# Patient Record
Sex: Female | Born: 1965 | Race: White | Hispanic: No | Marital: Married | Smoking: Former smoker
Health system: Southern US, Community
[De-identification: ages and names within clinical notes are randomized; demographics above are authoritative.]

## PROBLEM LIST (undated history)

## (undated) DIAGNOSIS — E039 Hypothyroidism, unspecified: Secondary | ICD-10-CM

## (undated) DIAGNOSIS — E785 Hyperlipidemia, unspecified: Secondary | ICD-10-CM

## (undated) DIAGNOSIS — K5790 Diverticulosis of intestine, part unspecified, without perforation or abscess without bleeding: Secondary | ICD-10-CM

## (undated) DIAGNOSIS — F410 Panic disorder [episodic paroxysmal anxiety] without agoraphobia: Secondary | ICD-10-CM

## (undated) HISTORY — PX: ABDOMINOPLASTY/PANNICULECTOMY WITH LIPOSUCTION: SHX5577

## (undated) HISTORY — DX: Hypothyroidism, unspecified: E03.9

## (undated) HISTORY — DX: Hyperlipidemia, unspecified: E78.5

## (undated) HISTORY — DX: Panic disorder (episodic paroxysmal anxiety): F41.0

---

## 2003-03-31 ENCOUNTER — Other Ambulatory Visit: Admission: RE | Admit: 2003-03-31 | Discharge: 2003-03-31 | Payer: Self-pay | Admitting: Obstetrics and Gynecology

## 2011-04-22 ENCOUNTER — Encounter: Payer: Self-pay | Admitting: Obstetrics & Gynecology

## 2011-05-07 LAB — BASIC METABOLIC PANEL
Creatinine: 0.8 mg/dL (ref 0.5–1.1)
Glucose: 99 mg/dL
POTASSIUM: 3.9 mmol/L (ref 3.4–5.3)

## 2011-05-07 LAB — LIPID PANEL
Cholesterol: 233 mg/dL — AB (ref 0–200)
HDL: 54 mg/dL (ref 35–70)
LDL Cholesterol: 144 mg/dL
Triglycerides: 177 mg/dL — AB (ref 40–160)

## 2011-05-07 LAB — CBC AND DIFFERENTIAL
HEMOGLOBIN: 14.5 g/dL (ref 12.0–16.0)
Platelets: 248 10*3/uL (ref 150–399)
WBC: 6.1 10*3/mL

## 2011-05-07 LAB — HEPATIC FUNCTION PANEL
ALT: 23 U/L (ref 7–35)
AST: 23 U/L (ref 13–35)
Alkaline Phosphatase: 48 U/L (ref 25–125)

## 2011-05-07 LAB — CMP14+LP+1AC+CBC/D/PLT+TSH: Calcium: 10.1 mg/dL

## 2012-05-16 ENCOUNTER — Emergency Department (INDEPENDENT_AMBULATORY_CARE_PROVIDER_SITE_OTHER)
Admission: EM | Admit: 2012-05-16 | Discharge: 2012-05-16 | Disposition: A | Discharge status: Home / Self Care | Payer: BC Managed Care – PPO | Source: Home / Self Care

## 2012-05-16 ENCOUNTER — Encounter: Payer: Self-pay | Admitting: *Deleted

## 2012-05-16 DIAGNOSIS — K5732 Diverticulitis of large intestine without perforation or abscess without bleeding: Secondary | ICD-10-CM

## 2012-05-16 HISTORY — DX: Diverticulosis of intestine, part unspecified, without perforation or abscess without bleeding: K57.90

## 2012-05-16 MED ORDER — SULFAMETHOXAZOLE-TRIMETHOPRIM 800-160 MG PO TABS: 1.0000 | ORAL_TABLET | Freq: Two times a day (BID) | ORAL | Status: DC

## 2012-05-16 MED ORDER — METRONIDAZOLE 500 MG PO TABS: 500.0000 mg | ORAL_TABLET | Freq: Four times a day (QID) | ORAL | Status: DC

## 2012-05-16 MED ORDER — CIPROFLOXACIN HCL 750 MG PO TABS: 750.0000 mg | ORAL_TABLET | Freq: Two times a day (BID) | ORAL | Status: DC

## 2012-05-16 NOTE — ED Provider Notes (Signed)
History     CSN: [id number]  Arrival date & time 05/16/12  1133   None     Chief Complaint  Patient presents with  . Diarrhea  . Nausea    (Consider location/radiation/quality/duration/timing/severity/associated sxs/prior treatment) HPI  This patient complains of nausea and diarrhea with blood in her stool for the last day or 2.  She does have a history of diverticulosis as diagnosed on a recent colonoscopy.  She also has a family history of diverticulosis and diverticulitis.  She has tried some over-the-counter ibuprofen and Tylenol which does help with the fevers.  The symptoms started out with a fever with no other symptoms and then progressed to the diarrhea with some stomach cramps and blood.  No recent travel or possible food poisoning.  Past Medical History  Diagnosis Date  . Diverticulosis     Past Surgical History  Procedure Laterality Date  . Cesarean section    . Abdominoplasty/panniculectomy with liposuction      Family History  Problem Relation Age of Onset  . Ulcerative colitis Mother   . Diverticulitis Mother   . Cancer Father   . Diverticulitis Father     History  Substance Use Topics  . Smoking status: Former Games developer  . Smokeless tobacco: Not on file  . Alcohol Use: Yes    OB History   Grav Para Term Preterm Abortions TAB SAB Ect Mult Living                  Review of Systems  All other systems reviewed and are negative.    Allergies  Penicillins  Home Medications   Current Outpatient Rx  Name  Route  Sig  Dispense  Refill  . ciprofloxacin (CIPRO) 750 MG tablet   Oral   Take 1 tablet (750 mg total) by mouth 2 (two) times daily.   18 tablet   0   . metroNIDAZOLE (FLAGYL) 500 MG tablet   Oral   Take 1 tablet (500 mg total) by mouth 4 (four) times daily.   36 tablet   0   . sulfamethoxazole-trimethoprim (SEPTRA DS) 800-160 MG per tablet   Oral   Take 1 tablet by mouth 2 (two) times daily.   18 tablet   0     BP 115/82   Pulse 90  Temp(Src) 97.4 F (36.3 C) (Oral)  Resp 16  Ht 5' 3.75" (1.619 m)  Wt 152 lb 8 oz (69.174 kg)  BMI 26.39 kg/m2  SpO2 98%  Physical Exam  Nursing note and vitals reviewed. Constitutional: She is oriented to person, place, and time. She appears well-developed and well-nourished.  HENT:  Head: Normocephalic and atraumatic.  Eyes: No scleral icterus.  Neck: Neck supple.  Cardiovascular: Normal rate, regular rhythm and normal heart sounds.   Pulmonary/Chest: Effort normal and breath sounds normal. No respiratory distress. She has no decreased breath sounds. She has no wheezes. She has no rhonchi.  Abdominal: Soft. Normal appearance. There is no tenderness. There is no rigidity, no rebound, no guarding, no CVA tenderness, no tenderness at McBurney's point and negative Murphy's sign.  Neurological: She is alert and oriented to person, place, and time.  Skin: Skin is warm and dry.  Psychiatric: She has a normal mood and affect. Her speech is normal.    ED Course  Procedures (including critical care time)  Labs Reviewed - No data to display No results found.   1. Diverticulitis of colon without hemorrhage  MDM   With her clinical picture and a history of diverticulosis, I believe that she does have diverticulitis.  Or at least have to assume until proven otherwise.  I was going to treat her with Cipro and Flagyl, but she states that she gets nauseous with Cipro and change that to Bactrim DS.  The treatment will be for 9 days and a letter no that she needs to be doing a bland diet if not a liquid diet for the next few days.  She also needs to inform her gastroenterologist her PCP about this.  I educated her on it and she thinks, now looking back, that she's had multiple of these episodes in the last few years.  I do not see any evidence of sepsis or severe disease needing to go to the hospital.  However if she is getting worse, then a CAT scan may be appropriate at that  time.  We're going to call the patient back in about 2 days and check on her.  Marlaine Hind, MD 05/16/12 [phone]

## 2012-05-16 NOTE — ED Notes (Signed)
Pt c/o nausea and diarrhea with blood in bowel; states she has a hx of diverticulosis. Has tried OTC Ibuprofen and Tylenol.

## 2012-05-18 ENCOUNTER — Telehealth: Payer: Self-pay | Admitting: *Deleted

## 2012-09-14 LAB — HEMOGLOBIN A1C: HEMOGLOBIN A1C: 5.4 % (ref 4.0–6.0)

## 2013-01-13 LAB — HEMOGLOBIN A1C: HEMOGLOBIN A1C: 5.5 % (ref 4.0–6.0)

## 2013-04-28 LAB — HEMOGLOBIN A1C: Hgb A1c MFr Bld: 5.2 % (ref 4.0–6.0)

## 2013-08-04 LAB — HEMOGLOBIN A1C: HEMOGLOBIN A1C: 5.4 % (ref 4.0–6.0)

## 2013-08-08 LAB — HM MAMMOGRAPHY: HM MAMMO: ABNORMAL

## 2013-08-11 ENCOUNTER — Other Ambulatory Visit: Payer: Self-pay | Admitting: Obstetrics and Gynecology

## 2013-08-11 DIAGNOSIS — Z139 Encounter for screening, unspecified: Secondary | ICD-10-CM

## 2013-08-18 ENCOUNTER — Encounter: Payer: Self-pay | Admitting: Sports Medicine

## 2013-08-18 ENCOUNTER — Ambulatory Visit (INDEPENDENT_AMBULATORY_CARE_PROVIDER_SITE_OTHER): Payer: BC Managed Care – PPO | Admitting: Sports Medicine

## 2013-08-18 ENCOUNTER — Ambulatory Visit (INDEPENDENT_AMBULATORY_CARE_PROVIDER_SITE_OTHER): Payer: BC Managed Care – PPO

## 2013-08-18 VITALS — BP 135/86 | HR 71 | Ht 64.0 in | Wt 160.0 lb

## 2013-08-18 DIAGNOSIS — M25561 Pain in right knee: Secondary | ICD-10-CM | POA: Insufficient documentation

## 2013-08-18 DIAGNOSIS — M25569 Pain in unspecified knee: Secondary | ICD-10-CM

## 2013-08-18 DIAGNOSIS — M23302 Other meniscus derangements, unspecified lateral meniscus, unspecified knee: Secondary | ICD-10-CM

## 2013-08-18 MED ORDER — MELOXICAM 15 MG PO TABS: ORAL_TABLET | ORAL | Status: DC

## 2013-08-18 NOTE — Assessment & Plan Note (Addendum)
After a twisting and slipping injury while playing tennis, I do have suspicion for injury to the anterior cruciate ligament, I was unable to get a good examination due to guarding. X-rays, Mobic, MRI. Return to go over MRI results. Continue knee sleeve

## 2013-08-18 NOTE — Progress Notes (Signed)
   Subjective:    I'm seeing this patient as a consultation for:  Dr. Angelena SoleWeston Saunders  CC: Right knee pain  HPI: This is a pleasant 48 year old female, for the past several weeks she's had pain which he localizes along the medial and lateral joint line, moderate, persistent. While playing tennis she recalls taking a misstep and feeling of popping or grinding. She did not have any immediate swelling and was able to continue playing. This happened a second time and she decided to come in to be seen. Currently she has no pain.  Past medical history, Surgical history, Family history not pertinant except as noted below, Social history, Allergies, and medications have been entered into the medical record, reviewed, and no changes needed.   Review of Systems: No headache, visual changes, nausea, vomiting, diarrhea, constipation, dizziness, abdominal pain, skin rash, fevers, chills, night sweats, weight loss, swollen lymph nodes, body aches, joint swelling, muscle aches, chest pain, shortness of breath, mood changes, visual or auditory hallucinations.   Objective:   General: Well Developed, well nourished, and in no acute distress.  Neuro/Psych: Alert and oriented x3, extra-ocular muscles intact, able to move all 4 extremities, sensation grossly intact. Skin: Warm and dry, no rashes noted.  Respiratory: Not using accessory muscles, speaking in full sentences, trachea midline.  Cardiovascular: Pulses palpable, no extremity edema. Abdomen: Does not appear distended. Right Knee: Normal to inspection with no erythema or effusion or obvious bony abnormalities. Palpation normal with no warmth, joint line tenderness, patellar tenderness, or condyle tenderness. ROM full in flexion and extension and lower leg rotation. Ligaments with solid consistent endpoints including ACL, PCL, LCL, MCL. I was unable to give a very solid ACL exam due to her guarding. Negative Mcmurray's, Apley's, and Thessalonian  tests. Non painful patellar compression. Patellar glide without crepitus. Patellar and quadriceps tendons unremarkable. Hamstring and quadriceps strength is normal.   X-rays show moderate medial compartment osteoarthritis.  Impression and Recommendations:   This case required medical decision making of moderate complexity.

## 2013-08-22 ENCOUNTER — Telehealth: Payer: Self-pay | Admitting: *Deleted

## 2013-08-22 NOTE — Telephone Encounter (Signed)
MRI of knee precert [id number] valid for 30 days as per [facility] [employer]. Corliss SkainsJamie Painter, CMA

## 2013-08-30 ENCOUNTER — Ambulatory Visit (INDEPENDENT_AMBULATORY_CARE_PROVIDER_SITE_OTHER): Payer: BC Managed Care – PPO

## 2013-08-30 DIAGNOSIS — Z1231 Encounter for screening mammogram for malignant neoplasm of breast: Secondary | ICD-10-CM

## 2013-08-30 DIAGNOSIS — Z139 Encounter for screening, unspecified: Secondary | ICD-10-CM

## 2013-08-31 ENCOUNTER — Other Ambulatory Visit: Payer: BC Managed Care – PPO

## 2013-09-14 ENCOUNTER — Other Ambulatory Visit: Payer: BC Managed Care – PPO

## 2013-10-26 ENCOUNTER — Emergency Department (HOSPITAL_BASED_OUTPATIENT_CLINIC_OR_DEPARTMENT_OTHER)
Admission: EM | Admit: 2013-10-26 | Discharge: 2013-10-27 | Disposition: A | Discharge status: Home / Self Care | Payer: BC Managed Care – PPO | Attending: Emergency Medicine | Admitting: Emergency Medicine

## 2013-10-26 ENCOUNTER — Encounter (HOSPITAL_BASED_OUTPATIENT_CLINIC_OR_DEPARTMENT_OTHER): Payer: Self-pay | Admitting: Emergency Medicine

## 2013-10-26 DIAGNOSIS — Z791 Long term (current) use of non-steroidal anti-inflammatories (NSAID): Secondary | ICD-10-CM | POA: Insufficient documentation

## 2013-10-26 DIAGNOSIS — K5732 Diverticulitis of large intestine without perforation or abscess without bleeding: Secondary | ICD-10-CM | POA: Insufficient documentation

## 2013-10-26 DIAGNOSIS — R109 Unspecified abdominal pain: Secondary | ICD-10-CM | POA: Diagnosis present

## 2013-10-26 DIAGNOSIS — Z3202 Encounter for pregnancy test, result negative: Secondary | ICD-10-CM | POA: Insufficient documentation

## 2013-10-26 DIAGNOSIS — Z880 Allergy status to penicillin: Secondary | ICD-10-CM | POA: Diagnosis not present

## 2013-10-26 DIAGNOSIS — Z87891 Personal history of nicotine dependence: Secondary | ICD-10-CM | POA: Insufficient documentation

## 2013-10-26 DIAGNOSIS — Z9889 Other specified postprocedural states: Secondary | ICD-10-CM | POA: Diagnosis not present

## 2013-10-26 LAB — PREGNANCY, URINE: Preg Test, Ur: NEGATIVE

## 2013-10-26 LAB — CBC WITH DIFFERENTIAL/PLATELET
BASOS ABS: 0 10*3/uL (ref 0.0–0.1)
BASOS PCT: 0 % (ref 0–1)
EOS PCT: 1 % (ref 0–5)
Eosinophils Absolute: 0.1 10*3/uL (ref 0.0–0.7)
HCT: 43 % (ref 36.0–46.0)
Hemoglobin: 14.4 g/dL (ref 12.0–15.0)
LYMPHS PCT: 13 % (ref 12–46)
Lymphs Abs: 1.8 10*3/uL (ref 0.7–4.0)
MCH: 30.6 pg (ref 26.0–34.0)
MCHC: 33.5 g/dL (ref 30.0–36.0)
MCV: 91.3 fL (ref 78.0–100.0)
Monocytes Absolute: 1.3 10*3/uL — ABNORMAL HIGH (ref 0.1–1.0)
Monocytes Relative: 10 % (ref 3–12)
Neutro Abs: 10 10*3/uL — ABNORMAL HIGH (ref 1.7–7.7)
Neutrophils Relative %: 76 % (ref 43–77)
PLATELETS: 200 10*3/uL (ref 150–400)
RBC: 4.71 MIL/uL (ref 3.87–5.11)
RDW: 12.3 % (ref 11.5–15.5)
WBC: 13.2 10*3/uL — AB (ref 4.0–10.5)

## 2013-10-26 LAB — URINALYSIS, ROUTINE W REFLEX MICROSCOPIC
BILIRUBIN URINE: NEGATIVE
Glucose, UA: NEGATIVE mg/dL
HGB URINE DIPSTICK: NEGATIVE
Ketones, ur: NEGATIVE mg/dL
Leukocytes, UA: NEGATIVE
Nitrite: NEGATIVE
PROTEIN: NEGATIVE mg/dL
Specific Gravity, Urine: 1.016 (ref 1.005–1.030)
UROBILINOGEN UA: 0.2 mg/dL (ref 0.0–1.0)
pH: 6.5 (ref 5.0–8.0)

## 2013-10-26 LAB — COMPREHENSIVE METABOLIC PANEL
ALT: 20 U/L (ref 0–35)
ANION GAP: 16 — AB (ref 5–15)
AST: 23 U/L (ref 0–37)
Albumin: 4.1 g/dL (ref 3.5–5.2)
Alkaline Phosphatase: 62 U/L (ref 39–117)
BILIRUBIN TOTAL: 0.7 mg/dL (ref 0.3–1.2)
BUN: 12 mg/dL (ref 6–23)
CHLORIDE: 98 meq/L (ref 96–112)
CO2: 28 meq/L (ref 19–32)
CREATININE: 1 mg/dL (ref 0.50–1.10)
Calcium: 10.5 mg/dL (ref 8.4–10.5)
GFR calc Af Amer: 75 mL/min — ABNORMAL LOW (ref >90)
GFR, EST NON AFRICAN AMERICAN: 65 mL/min — AB (ref >90)
Glucose, Bld: 117 mg/dL — ABNORMAL HIGH (ref 70–99)
Potassium: 4.6 mEq/L (ref 3.7–5.3)
Sodium: 142 mEq/L (ref 137–147)
Total Protein: 8.2 g/dL (ref 6.0–8.3)

## 2013-10-26 LAB — LIPASE, BLOOD: Lipase: 30 U/L (ref 11–59)

## 2013-10-26 NOTE — ED Notes (Signed)
C/o left side abd pain stared last night-fever started today-denies n/v/d

## 2013-10-27 MED ORDER — HYDROCODONE-ACETAMINOPHEN 5-325 MG PO TABS: 2.0000 | ORAL_TABLET | ORAL | Status: DC | PRN

## 2013-10-27 MED ORDER — METRONIDAZOLE 500 MG PO TABS: 500.0000 mg | ORAL_TABLET | Freq: Three times a day (TID) | ORAL | Status: DC

## 2013-10-27 MED ORDER — CIPROFLOXACIN HCL 500 MG PO TABS: 500.0000 mg | ORAL_TABLET | Freq: Two times a day (BID) | ORAL | Status: DC

## 2013-10-27 MED ORDER — METRONIDAZOLE 500 MG PO TABS
500.0000 mg | ORAL_TABLET | Freq: Once | ORAL | Status: AC
  Administered 2013-10-27: 500 mg via ORAL
  Filled 2013-10-27: qty 1

## 2013-10-27 MED ORDER — CIPROFLOXACIN IN D5W 400 MG/200ML IV SOLN
400.0000 mg | Freq: Once | INTRAVENOUS | Status: AC
  Administered 2013-10-27: 400 mg via INTRAVENOUS
  Filled 2013-10-27: qty 200

## 2013-10-27 NOTE — Discharge Instructions (Signed)

## 2013-10-27 NOTE — ED Provider Notes (Signed)
CSN: [id number]     Arrival date & time 10/26/13  2023 History   First MD Initiated Contact with Patient 10/27/13 0002     Chief Complaint  Patient presents with  . Abdominal Pain     (Consider location/radiation/quality/duration/timing/severity/associated sxs/prior Treatment) HPI Comments: Patient presents to the ER for evaluation of left-sided abdominal pain with a low-grade fever. Symptoms began earlier today. She has not had a nausea, vomiting or diarrhea. Patient reports that she has a history of diverticulosis diagnosed by colonoscopy. She had colonoscopy because of irritable bowel syndrome and rectal bleeding many years ago. She was expecting similar symptoms approximately a year ago was treated empirically for diverticulitis.   Patient is a 48 y.o. female presenting with abdominal pain.  Abdominal Pain Associated symptoms: fever     Past Medical History  Diagnosis Date  . Diverticulosis    Past Surgical History  Procedure Laterality Date  . Cesarean section    . Abdominoplasty/panniculectomy with liposuction     Family History  Problem Relation Age of Onset  . Ulcerative colitis Mother   . Diverticulitis Mother   . Cancer Father   . Diverticulitis Father    History  Substance Use Topics  . Smoking status: Former Games developer  . Smokeless tobacco: Not on file  . Alcohol Use: Yes   OB History   Grav Para Term Preterm Abortions TAB SAB Ect Mult Living                 Review of Systems  Constitutional: Positive for fever.  Gastrointestinal: Positive for abdominal pain.  All other systems reviewed and are negative.     Allergies  Penicillins  Home Medications   Prior to Admission medications   Medication Sig Start Date End Date Taking? Authorizing Provider  meloxicam (MOBIC) 15 MG tablet One tab PO qAM with breakfast for 2 weeks, then daily prn pain. 08/18/13   Monica Becton, MD   BP 137/86  Pulse 84  Temp(Src) 99.8 F (37.7 C) (Oral)  Resp 16  Ht   (1.6 m)  Wt 158 lb (71.668 kg)  BMI 28.00 kg/m2  SpO2 99% Physical Exam  Constitutional: She is oriented to person, place, and time. She appears well-developed and well-nourished. No distress.  HENT:  Head: Normocephalic and atraumatic.  Right Ear: Hearing normal.  Left Ear: Hearing normal.  Nose: Nose normal.  Mouth/Throat: Oropharynx is clear and moist and mucous membranes are normal.  Eyes: Conjunctivae and EOM are normal. Pupils are equal, round, and reactive to light.  Neck: Normal range of motion. Neck supple.  Cardiovascular: Regular rhythm, S1 normal and S2 normal.  Exam reveals no gallop and no friction rub.   No murmur heard. Pulmonary/Chest: Effort normal and breath sounds normal. No respiratory distress. She exhibits no tenderness.  Abdominal: Soft. Normal appearance and bowel sounds are normal. There is no hepatosplenomegaly. There is tenderness in the left lower quadrant. There is no rebound, no guarding, no tenderness at McBurney's point and negative Murphy's sign. No hernia.  Musculoskeletal: Normal range of motion.  Neurological: She is alert and oriented to person, place, and time. She has normal strength. No cranial nerve deficit or sensory deficit. Coordination normal. GCS eye subscore is 4. GCS verbal subscore is 5. GCS motor subscore is 6.  Skin: Skin is warm, dry and intact. No rash noted. No cyanosis.  Psychiatric: She has a normal mood and affect. Her speech is normal and behavior is normal. Thought content normal.  ED Course  Procedures (including critical care time) Labs Review Labs Reviewed  COMPREHENSIVE METABOLIC PANEL - Abnormal; Notable for the following:    Glucose, Bld 117 (*)    GFR calc non Af Amer 65 (*)    GFR calc Af Amer 75 (*)    Anion gap 16 (*)    All other components within normal limits  CBC WITH DIFFERENTIAL - Abnormal; Notable for the following:    WBC 13.2 (*)    Neutro Abs 10.0 (*)    Monocytes Absolute 1.3 (*)    All  other components within normal limits  URINALYSIS, ROUTINE W REFLEX MICROSCOPIC  PREGNANCY, URINE  LIPASE, BLOOD    Imaging Review No results found.   EKG Interpretation None      MDM   Final diagnoses:  None   abdominal pain  Diverticulitis  Patient presents to the ER for evaluation of low-grade fever and left lower quadrant abdominal pain. She does not have fever arrival to the ER. The patient reports a history of diverticulosis seen on colonoscopy. She was treated once in the past empirically for diverticulitis with resolution of the symptoms. Examination today does reveal tenderness over quadrant without any peritoneal signs. I suspect that this is diverticulitis. I did have a lengthy conversation with the patient about diagnostics including CT scan. She does not wish to have the CT scan performed at this time. I do not suspect any carpeting factors such as bowel perforation or abscess based on her examination presentation. The patient was therefore initiated on empiric treatment, told to come back to the ER immediately if she is running high fevers, increasing pain.    Gilda Crease, MD 10/27/13 [phone]

## 2014-02-06 ENCOUNTER — Encounter: Payer: Self-pay | Admitting: Family Medicine

## 2014-02-06 ENCOUNTER — Ambulatory Visit (INDEPENDENT_AMBULATORY_CARE_PROVIDER_SITE_OTHER): Payer: BC Managed Care – PPO | Admitting: Family Medicine

## 2014-02-06 VITALS — BP 149/91 | HR 71 | Ht 63.5 in | Wt 163.0 lb

## 2014-02-06 DIAGNOSIS — K589 Irritable bowel syndrome without diarrhea: Secondary | ICD-10-CM | POA: Diagnosis not present

## 2014-02-06 DIAGNOSIS — M79641 Pain in right hand: Secondary | ICD-10-CM | POA: Diagnosis not present

## 2014-02-06 DIAGNOSIS — Z23 Encounter for immunization: Secondary | ICD-10-CM | POA: Diagnosis not present

## 2014-02-06 DIAGNOSIS — M79672 Pain in left foot: Secondary | ICD-10-CM

## 2014-02-06 DIAGNOSIS — R030 Elevated blood-pressure reading, without diagnosis of hypertension: Secondary | ICD-10-CM | POA: Diagnosis not present

## 2014-02-06 DIAGNOSIS — R0602 Shortness of breath: Secondary | ICD-10-CM

## 2014-02-06 DIAGNOSIS — Z1159 Encounter for screening for other viral diseases: Secondary | ICD-10-CM

## 2014-02-06 DIAGNOSIS — Z2839 Other underimmunization status: Secondary | ICD-10-CM

## 2014-02-06 DIAGNOSIS — IMO0001 Reserved for inherently not codable concepts without codable children: Secondary | ICD-10-CM

## 2014-02-06 DIAGNOSIS — K5730 Diverticulosis of large intestine without perforation or abscess without bleeding: Secondary | ICD-10-CM | POA: Insufficient documentation

## 2014-02-06 DIAGNOSIS — Z283 Underimmunization status: Secondary | ICD-10-CM

## 2014-02-06 MED ORDER — ALBUTEROL SULFATE HFA 108 (90 BASE) MCG/ACT IN AERS: INHALATION_SPRAY | RESPIRATORY_TRACT | Status: DC

## 2014-02-06 NOTE — Addendum Note (Signed)
Addended by: Avon GullyMCCRIMMON, Della Scrivener C on: 02/06/2014 11:30 AM   Modules accepted: Medications

## 2014-02-06 NOTE — Progress Notes (Signed)
CC: Patricia Farmer is a 48 y.o. female is here for Establish Care and Tdap and Hep B titers   Subjective: HPI:  Pleasant 48 year old here to establish care  Complains of right hand pain that is localized at the base of the thumb that has been present for at least a month now. Presently daily basis. Worse if she sleeps with her body or head on that hand. Described as a soreness. Interventions have included massage therapy yesterday with no benefit. No other interventions as of yet. She denies swelling redness or any overlying skin changes at the site of the pain. It radiates up the lateral forearm. Other than above nothing particularly makes it better or worse. She denies any recent or remote injury to this hand.  She's been offered a hepatitis B vaccination at work and she wants to know whether or not she should get this. She doesn't want to get it if she doesn't absolutely need to have it but she does want to ensure that she is immune to hepatitis B virus. She believes she may have gotten this vaccine as a child but never had a booster in adulthood. She also wants to know if she should have a Tdap vaccination. It's been well over 10 years since she had a tetanus booster.  Elevated blood pressure: She carries no formal diagnosis of hypertension. She is currently not an any blood pressure lowering medications but she does work out regularly. On chart review the majority of blood pressures over the past 1 or 2 years have always been in the normotensive or prehypertensive range.  She complains of a history of irritable bowel syndrome. She had a colonoscopy within the last 5 years and was found to have a premalignant polyp per her report. She wants to know she should get a colonoscopy at age 54 even though it's not due yet. She currently denies any constipation diarrhea or abdominal pain  Complains of left foot pain that has been present for matter of at least one year. It's present on a daily basis for  mild to moderate in severity. It's worse when plantar flexing the foot and localized near the ball of the foot deep within the foot and nonradiating. Pain came on suddenly during a tennis match about a year ago. Symptoms are slightly improved with doing home physical therapy. No other interventions.  Complains of shortness of breath that has been present for an unknown amount of time and only occurs when she is exercising. It does not seem to be getting better or worse since its onset months ago. Her mother had a history of asthma however the patient denies any known lung disease other than a smoking history in the remote past. There is been no chest pain with exertion   Review of Systems - General ROS: negative for - chills, fever, night sweats, weight gain or weight loss Ophthalmic ROS: negative for - decreased vision Psychological ROS: negative for - anxiety or depression ENT ROS: negative for - hearing change, nasal congestion, tinnitus or allergies Hematological and Lymphatic ROS: negative for - bleeding problems, bruising or swollen lymph nodes Breast ROS: negative Respiratory ROS: no cough,  or wheezing Cardiovascular ROS: no chest pain or dyspnea on exertion Gastrointestinal ROS: no abdominal pain, change in bowel habits, or black or bloody stools Genito-Urinary ROS: negative for - genital discharge, genital ulcers, incontinence or abnormal bleeding from genitals Musculoskeletal ROS: negative for - joint pain or muscle pain Neurological ROS: negative for - headaches  or memory loss Dermatological ROS: negative for lumps, mole changes, rash and skin lesion changes  Past Medical History  Diagnosis Date  . Diverticulosis     Past Surgical History  Procedure Laterality Date  . Cesarean section    . Abdominoplasty/panniculectomy with liposuction     Family History  Problem Relation Age of Onset  . Ulcerative colitis Mother   . Diverticulitis Mother   . Cancer Father   .  Diverticulitis Father     History   Social History  . Marital Status: Married    Spouse Name: N/A    Number of Children: N/A  . Years of Education: N/A   Occupational History  . Not on file.   Social History Main Topics  . Smoking status: Former Games developermoker  . Smokeless tobacco: Not on file  . Alcohol Use: Yes  . Drug Use: No  . Sexual Activity: Not on file   Other Topics Concern  . Not on file   Social History Narrative     Objective: BP 149/91 mmHg  Pulse 71  Ht 5' 3.5" (1.613 m)  Wt 163 lb (73.936 kg)  BMI 28.42 kg/m2  General: Alert and Oriented, No Acute Distress HEENT: Pupils equal, round, reactive to light. Conjunctivae clear.  External ears unremarkable, canals clear with intact TMs with appropriate landmarks.  Middle ear appears open without effusion. Pink inferior turbinates.  Moist mucous membranes, pharynx without inflammation nor lesions.  Neck supple without palpable lymphadenopathy nor abnormal masses. Lungs: Clear to auscultation bilaterally, no wheezing/ronchi/rales.  Comfortable work of breathing. Good air movement. Cardiac: Regular rate and rhythm. Normal S1/S2.  No murmurs, rubs, nor gallops.   Extremities: No peripheral edema.  Strong peripheral pulses.  Right Wrist Exam: Full ROM and strength in the wrist without any swelling/redness/warmth at the site of her discomfort.  No pain in the anatomical snuff box.  Finklestein's positive.   Left Foot Exam: Ful ROM and strength. No pain on the inferior aspect of the calcaneus with palpation. No pain at the medial or lateral malleoli. No pain at the base of the fifth metatarsal. No pain with palpation of the plantar surface of the metatarsal heads nor is there pain with plantar dorsal manipulation of any of the metatarsals. Mental Status: No depression, anxiety, nor agitation. Skin: Warm and dry.  Assessment & Plan: Patricia Farmer was seen today for establish care and tdap and hep b titers.  Diagnoses and associated  orders for this visit:  Immunization deficiency - Tdap vaccine greater than or equal to 7yo IM  Need for hepatitis B screening test - Hepatitis B Surface AntiBODY  Right hand pain  Elevated blood pressure - Lipid panel - BASIC METABOLIC PANEL WITH GFR  IBS (irritable bowel syndrome)  Left foot pain  Shortness of breath on exertion - albuterol (PROVENTIL HFA;VENTOLIN HFA) 108 (90 BASE) MCG/ACT inhaler; Inhale two puffs every 4-6 hours only as needed for shortness of breath or wheezing.    Immunization deficiency: Received Tdap today and checking hepatitis B immunization status to help her determine whether or not she'll get a booster. Right hand pain: Suspect Dequervain's and offered her a thumb and wrist immobilizer however she believes that Cordelia PenSherry has one at home. Advised to wear this 24 hours a day other than when grooming or bathing, if this is unrealistic then only wear while sleeping.Immobilization should be achieved for at least 3-4 weeks. Elevated blood pressure: Checking renal function, she also takes it's been well over a year  since lipids were checked last. If blood pressure is elevated at a consecutive repeat visit will start antihypertensives. Irritable bowel syndrome: Controlled no current intervention needed Left foot pain: Likely due to tendinitis without adequate healing environment, discussed using home physical therapy and a balance ball at her work. Shortness of breath: Begin albuterol as needed for what sounds to be exercise-induced asthma however will not give this diagnosis unless albuterol relieves shortness of breath.  45 minutes spent face-to-face during visit today of which at least 50% was counseling or coordinating care regarding: 1. Immunization deficiency   2. Need for hepatitis B screening test   3. Right hand pain   4. Elevated blood pressure   5. IBS (irritable bowel syndrome)   6. Left foot pain   7. Shortness of breath on exertion        Return in about 3 months (around 05/08/2014) for Blood Pressure and Joint Pain Check.

## 2014-02-09 ENCOUNTER — Encounter: Payer: Self-pay | Admitting: Family Medicine

## 2014-02-09 DIAGNOSIS — Z8709 Personal history of other diseases of the respiratory system: Secondary | ICD-10-CM | POA: Insufficient documentation

## 2014-02-09 DIAGNOSIS — E349 Endocrine disorder, unspecified: Secondary | ICD-10-CM | POA: Insufficient documentation

## 2014-02-15 ENCOUNTER — Encounter: Payer: Self-pay | Admitting: Family Medicine

## 2014-02-15 DIAGNOSIS — R87619 Unspecified abnormal cytological findings in specimens from cervix uteri: Secondary | ICD-10-CM | POA: Insufficient documentation

## 2014-02-15 DIAGNOSIS — Z86010 Personal history of colonic polyps: Secondary | ICD-10-CM | POA: Insufficient documentation

## 2014-05-05 ENCOUNTER — Encounter: Payer: Self-pay | Admitting: Family Medicine

## 2014-07-21 ENCOUNTER — Telehealth: Payer: Self-pay | Admitting: Family Medicine

## 2014-07-21 NOTE — Telephone Encounter (Signed)
Opened for care everywhere 

## 2015-03-11 IMAGING — CR DG KNEE COMPLETE 4+V*R*
4 series · 4 of 4 positions shown · non-contrast
Comparison: None.

CLINICAL DATA: 48-year-old female with knee pain right greater than
left. Initial encounter.

EXAM:
RIGHT KNEE - COMPLETE 4+ VIEW

[view not recorded (1 of 4)]
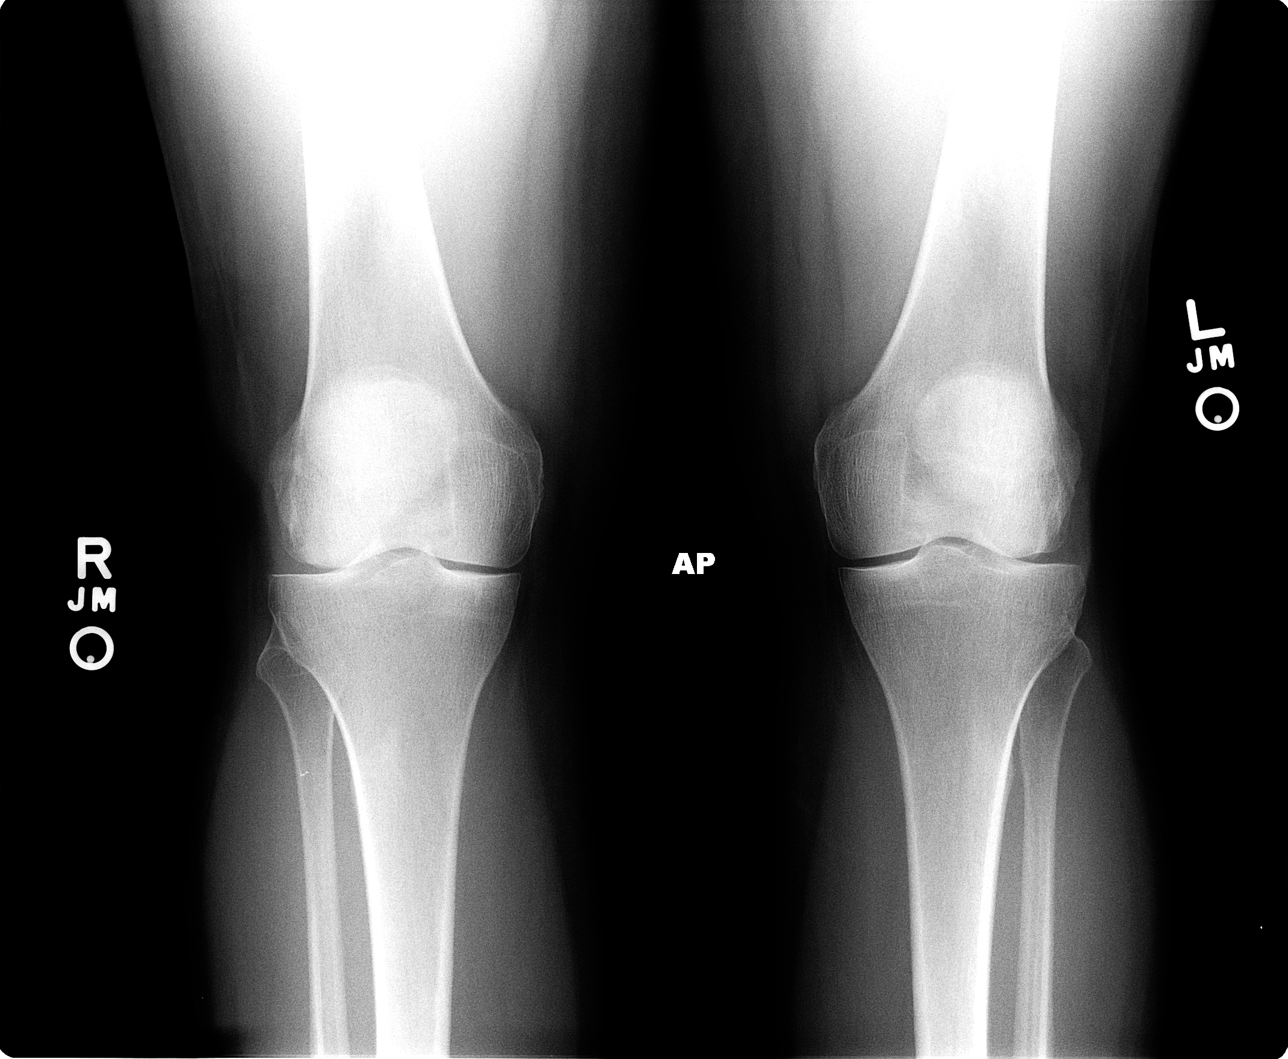

[view not recorded (2 of 4)]
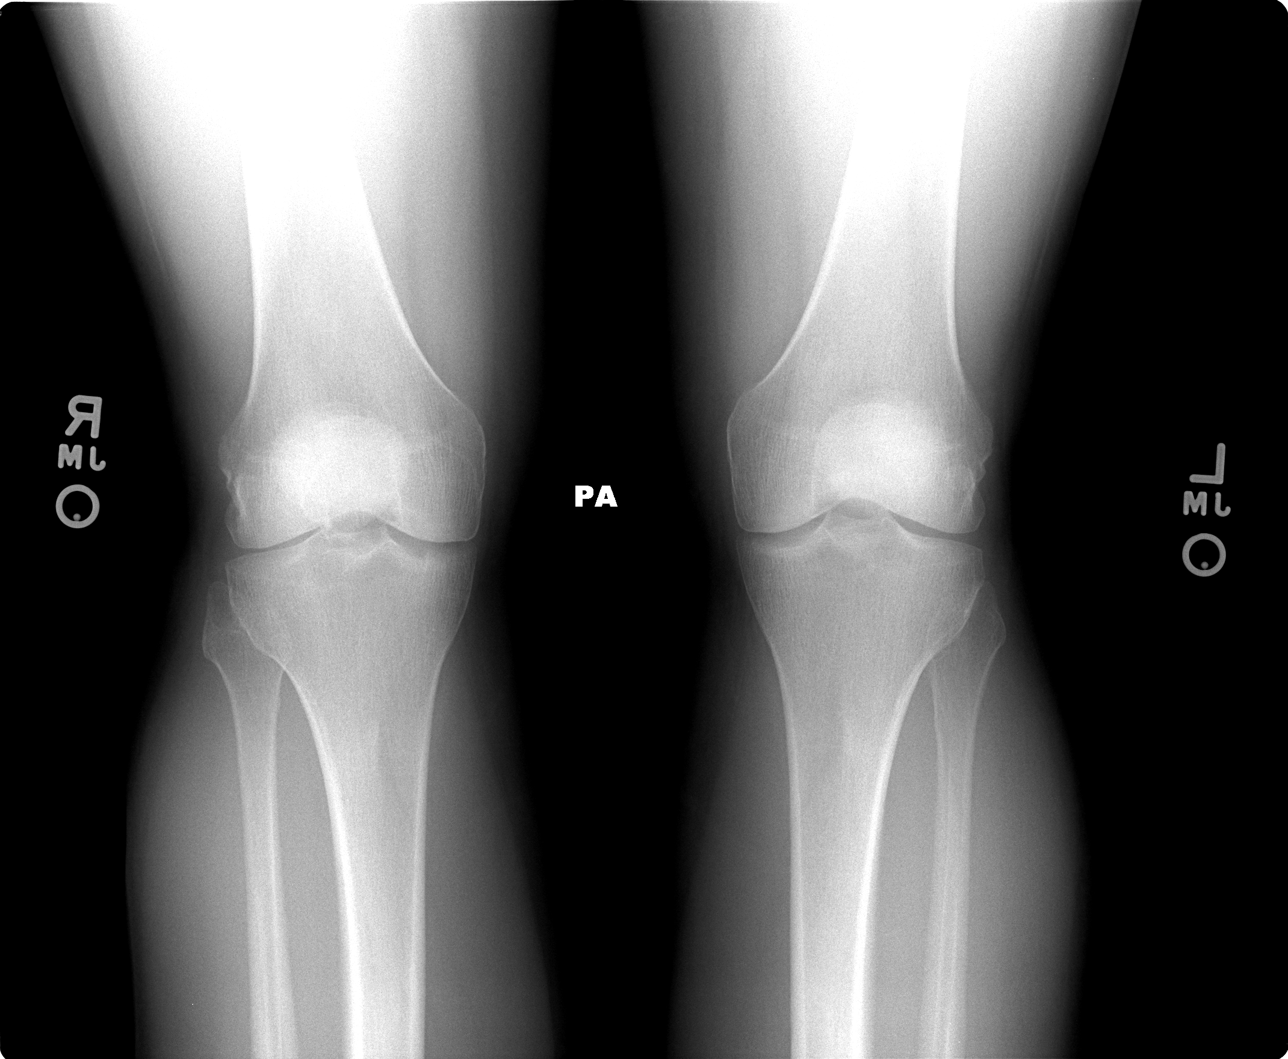

[view not recorded (3 of 4)]
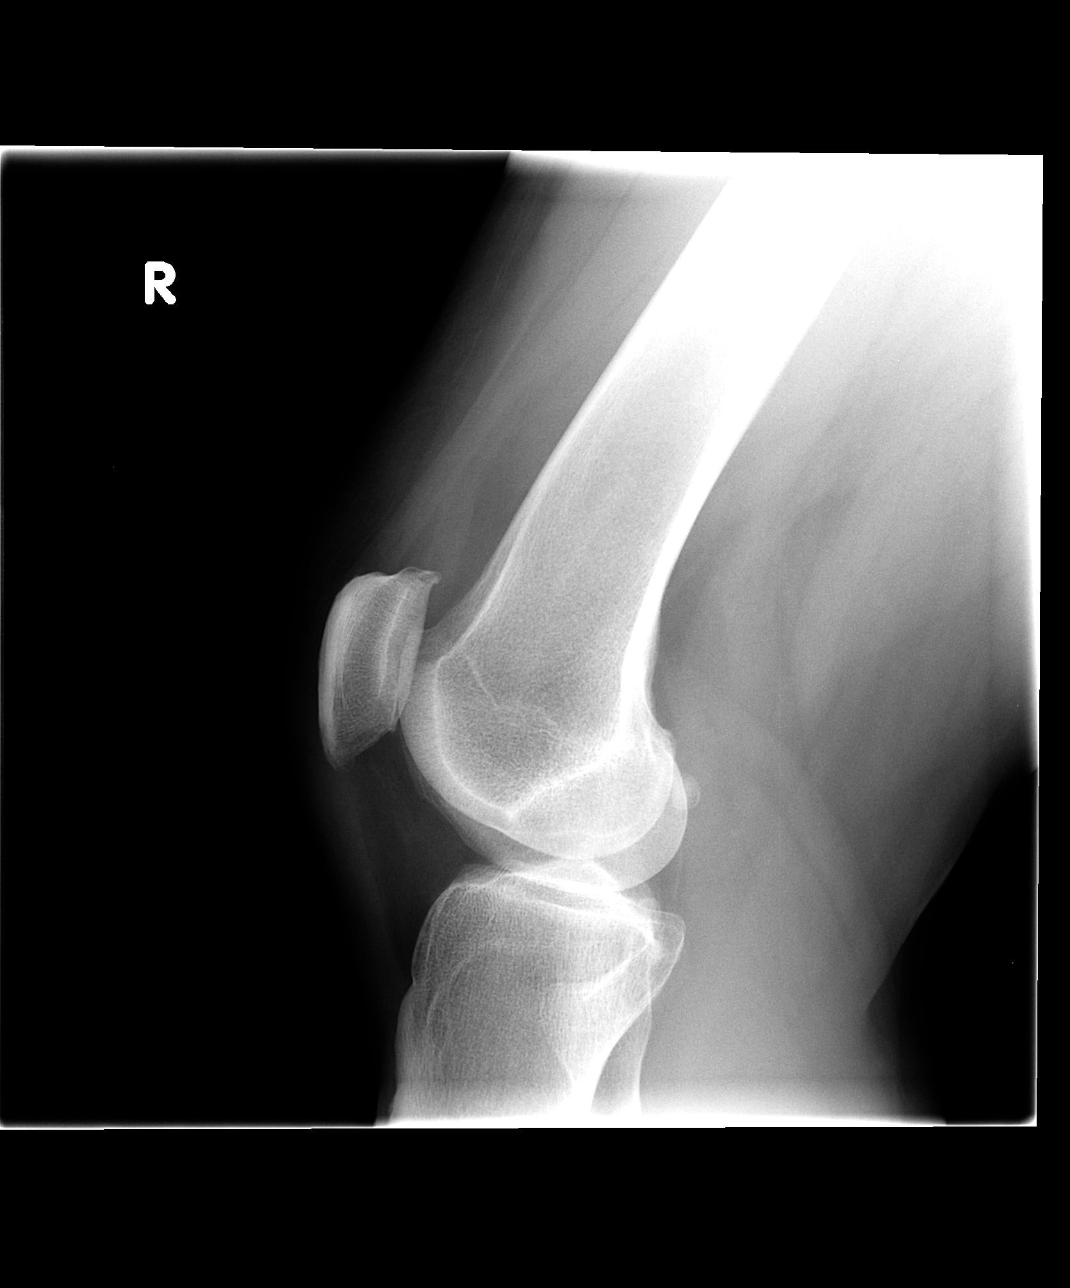

[view not recorded (4 of 4)]
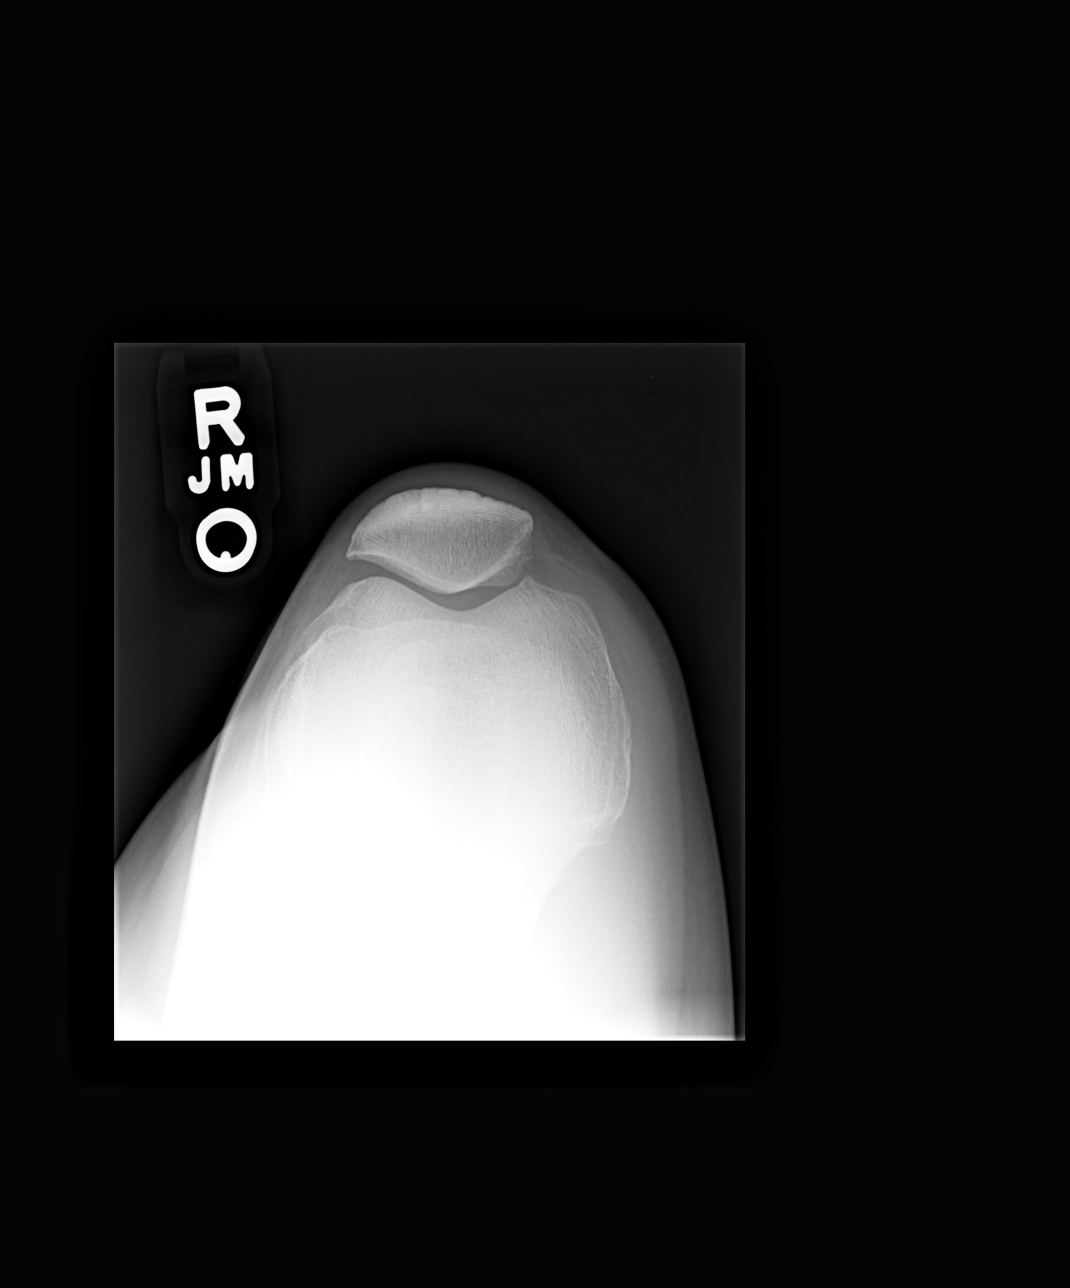

[4 of 4 positions shown; findings below may reference images not displayed]

FINDINGS: AP standing view that includes both knees demonstrates medial
compartment joint space loss bilaterally but greater on the right.
Minimal associated subchondral sclerosis and spurring.
Patellofemoral joint space preserved. Mild superior patellar
degenerative spurring. No definite joint effusion. Normal underlying
bone mineralization. No acute fracture.
IMPRESSION: Mild medial compartment knee joint degeneration, greater on the
right.

## 2018-02-10 ENCOUNTER — Ambulatory Visit: Payer: Self-pay

## 2018-02-10 ENCOUNTER — Ambulatory Visit (INDEPENDENT_AMBULATORY_CARE_PROVIDER_SITE_OTHER): Payer: 59 | Admitting: Family Medicine

## 2018-02-10 VITALS — BP 128/90 | HR 80 | Ht 63.5 in | Wt 162.0 lb

## 2018-02-10 DIAGNOSIS — M25511 Pain in right shoulder: Secondary | ICD-10-CM | POA: Diagnosis not present

## 2018-02-10 DIAGNOSIS — G8929 Other chronic pain: Secondary | ICD-10-CM

## 2018-02-10 NOTE — Progress Notes (Signed)
Tawana ScaleZach Farmer D.O. [facility] [address] [place], [place] [address] Phone: [phone] Subjective:    I'm seeing this patient by the request  of:  Laren BoomHommel, Sean, DO   CC: Right shoulder pain  BJY:[id number]:Subjective  Patricia HarborMeredith Farmer is a 52 y.o. female coming in with complaint of right shoulder pain since September. Anterior shoulder pain. Pain increases with shoulder flexion and lateral raises with thumb down. Patient does [employer] and feels she may have injured her shoulder performing snatches and kip ups. No pain with [employer] and Companymilitary press. Has been modifying exercises. Is using Voltaren gel. Denies any radiating symptoms.      Past Medical History:  Diagnosis Date  . Diverticulosis    Past Surgical History:  Procedure Laterality Date  . ABDOMINOPLASTY/PANNICULECTOMY WITH LIPOSUCTION    . CESAREAN SECTION     Social History   Socioeconomic History  . Marital status: Married    Spouse name: Not on file  . Number of children: Not on file  . Years of education: Not on file  . Highest education level: Not on file  Occupational History  . Not on file  Social Needs  . Financial resource strain: Not on file  . Food insecurity:    Worry: Not on file    Inability: Not on file  . Transportation needs:    Medical: Not on file    Non-medical: Not on file  Tobacco Use  . Smoking status: Former Smoker  Substance and Sexual Activity  . Alcohol use: Yes  . Drug use: No  . Sexual activity: Not on file  Lifestyle  . Physical activity:    Days per week: Not on file    Minutes per session: Not on file  . Stress: Not on file  Relationships  . Social connections:    Talks on phone: Not on file    Gets together: Not on file    Attends religious service: Not on file    Active member of club or organization: Not on file    Attends meetings of clubs or organizations: Not on file    Relationship status: Not on file  Other Topics Concern  . Not on file  Social History Narrative  .  Not on file   Allergies  Allergen Reactions  . Penicillins Rash    As a child   Family History  Problem Relation Age of Onset  . Ulcerative colitis Mother   . Diverticulitis Mother   . Cancer Father   . Diverticulitis Father     Current Outpatient Medications (Endocrine & Metabolic):  Marland Kitchen.  ESTROGENS CONJUGATED PO, Take by mouth. .  Progesterone Micronized (PROGESTERONE PO), Take by mouth. .  Thyroid 97.5 MG TABS, Take by mouth.   Current Outpatient Medications (Respiratory):  .  albuterol (PROVENTIL HFA;VENTOLIN HFA) 108 (90 BASE) MCG/ACT inhaler, Inhale two puffs every 4-6 hours only as needed for shortness of breath or wheezing.       Past medical history, social, surgical and family history all reviewed in electronic medical record.  No pertanent information unless stated regarding to the chief complaint.   Review of Systems:  No headache, visual changes, nausea, vomiting, diarrhea, constipation, dizziness, abdominal pain, skin rash, fevers, chills, night sweats, weight loss, swollen lymph nodes, body aches, joint swelling, muscle aches, chest pain, shortness of breath, mood changes.   Objective  There were no vitals taken for this visit. Systems examined below as of    General: No apparent distress alert  and oriented x3 mood and affect normal, dressed appropriately.  HEENT: Pupils equal, extraocular movements intact  Respiratory: Patient's speak in full sentences and does not appear short of breath  Cardiovascular: No lower extremity edema, non tender, no erythema  Skin: Warm dry intact with no signs of infection or rash on extremities or on axial skeleton.  Abdomen: Soft nontender  Neuro: Cranial nerves II through XII are intact, neurovascularly intact in all extremities with 2+ DTRs and 2+ pulses.  Lymph: No lymphadenopathy of posterior or anterior cervical chain or axillae bilaterally.  Gait normal with good balance and coordination.  MSK:  Non tender with full  range of motion and good stability and symmetric strength and tone of  elbows, wrist, hip, knee and ankles bilaterally.  Shoulder: Right Inspection reveals no abnormalities, atrophy or asymmetry. Palpation is normal with no tenderness over AC joint or bicipital groove. ROM is full in all planes passively. Rotator cuff strength normal throughout. signs of impingement with positive Neer and Hawkin's tests, but negative empty can sign. Speeds and Yergason's tests normal. Positive O'Brien's Normal scapular function observed. No painful arc and no drop arm sign. No apprehension sign  MSK US performed of: Right This study was ordered, performed, and interpreted by Patricia Farmer D.O.  Shoulder:   Supraspinatus:  Appears normal on long and transverse views, Bursal bulge seen with shoulder abduction on impingement view.  Subscapularis:  Appears normal on long and transverse views. Positive bursa Teres Minor:  Appears normal on long and transverse views. AC joint: Moderate arthritis Glenohumeral Joint:  Appears normal without effusion. Glenoid Labrum: Calcific changes noted. Biceps Tendon:  Appears normal on long and transverse views, no fraying of tendon, tendon located in intertubercular groove, no subluxation with shoulder internal or external rotation.  Impression: Subacromial bursitis calcific changes of the labrum  97110; 15 additional minutes spent for Therapeutic exercises as stated in above notes.  This included exercises focusing on stretching, strengthening, with significant focus on eccentric aspects.   Long term goals include an improvement in range of motion, strength, endurance as well as avoiding reinjury. Patient's frequency would include in 1-2 times a day, 3-5 times a week for a duration of 6-12 weeks. Shoulder Exercises that included:  Basic scapular stabilization to include adduction and depression of scapula Scaption, focusing on proper movement and good control Internal and  External rotation utilizing a theraband, with elbow tucked at side entire time Rows with theraband   Proper technique shown and discussed handout in great detail with ATC.  All questions were discussed and answered.     Impression and Recommendations:     This case required medical decision making of moderate complexity. The above documentation has been reviewed and is accurate and complete Judi Saa, DO       Note: This dictation was prepared with Dragon dictation along with smaller phrase technology. Any transcriptional errors that result from this process are unintentional.

## 2018-02-10 NOTE — Patient Instructions (Addendum)
Good to see you.  Ice 20 minutes 2 times daily. Usually after activity and before bed. pennsaid pinkie amount topically 2 times daily as needed.  Exercises 3 times a week.  Keep hands within peripheral vision  Over the counter get  Vitamin D 2000 IU daily and stop the K2  Tart cherry extract 1500mg  at night Turmeric 500mg  twice daily  You should do great  See me again in 5-6 weeks

## 2018-02-10 NOTE — Assessment & Plan Note (Signed)
Right shoulder pain.  Likely more of a calcific change of the labrum posteriorly.  Mild bursitis.  Home exercise given, icing regimen, topical anti-inflammatories.  Discussed which activities to do which wants to avoid.  Follow-up again in 4 to 6 weeks.

## 2018-03-23 ENCOUNTER — Ambulatory Visit: Payer: 59 | Admitting: Family Medicine

## 2018-04-03 NOTE — Progress Notes (Signed)
Patricia Farmer [address] [place], [place] [address] Phone: [phone] Subjective:    CC: Right shoulder pain  [id number]  Patricia Farmer is a 53 y.o. female coming in with complaint of right shoulder pain.  Was having more of a calcific bursitis and some abnormality of the labrum.  Saw another provider.  Had had PRP with 3 different injections over the course the last 6 weeks.  Patient states that she is making some improvement.  Patient is here to make sure that she would be able to start working out.  Has not been doing it on a regular basis.    Past Medical History:  Diagnosis Date  . Diverticulosis    Past Surgical History:  Procedure Laterality Date  . ABDOMINOPLASTY/PANNICULECTOMY WITH LIPOSUCTION    . CESAREAN SECTION     Social History   Socioeconomic History  . Marital status: Married    Spouse name: Not on file  . Number of children: Not on file  . Years of education: Not on file  . Highest education level: Not on file  Occupational History  . Not on file  Social Needs  . Financial resource strain: Not on file  . Food insecurity:    Worry: Not on file    Inability: Not on file  . Transportation needs:    Medical: Not on file    Non-medical: Not on file  Tobacco Use  . Smoking status: Former Games developer  . Smokeless tobacco: Never Used  Substance and Sexual Activity  . Alcohol use: Yes  . Drug use: No  . Sexual activity: Not on file  Lifestyle  . Physical activity:    Days per week: Not on file    Minutes per session: Not on file  . Stress: Not on file  Relationships  . Social connections:    Talks on phone: Not on file    Gets together: Not on file    Attends religious service: Not on file    Active member of club or organization: Not on file    Attends meetings of clubs or organizations: Not on file    Relationship status: Not on file  Other Topics Concern  . Not on file  Social History Narrative  . Not on file    Allergies  Allergen Reactions  . Penicillins Rash    As a child   Family History  Problem Relation Age of Onset  . Ulcerative colitis Mother   . Diverticulitis Mother   . Cancer Father   . Diverticulitis Father     Current Outpatient Medications (Endocrine & Metabolic):  Marland Kitchen  ESTROGENS CONJUGATED PO, Take by mouth. .  Progesterone Micronized (PROGESTERONE PO), Take by mouth. .  Thyroid 97.5 MG TABS, Take by mouth.   Current Outpatient Medications (Respiratory):  .  albuterol (PROVENTIL HFA;VENTOLIN HFA) 108 (90 BASE) MCG/ACT inhaler, Inhale two puffs every 4-6 hours only as needed for shortness of breath or wheezing.       Past medical history, social, surgical and family history all reviewed in electronic medical record.  No pertanent information unless stated regarding to the chief complaint.   Review of Systems:  No headache, visual changes, nausea, vomiting, diarrhea, constipation, dizziness, abdominal pain, skin rash, fevers, chills, night sweats, weight loss, swollen lymph nodes, body aches, joint swelling, muscle aches, chest pain, shortness of breath, mood changes.   Objective  Blood pressure 126/82, pulse 98, height 5' 3.5" (1.613 m), weight 159 lb (  72.1 kg), SpO2 (!) 61 %.   General: No apparent distress alert and oriented x3 mood and affect normal, dressed appropriately.  HEENT: Pupils equal, extraocular movements intact  Respiratory: Patient's speak in full sentences and does not appear short of breath  Cardiovascular: No lower extremity edema, non tender, no erythema  Skin: Warm dry intact with no signs of infection or rash on extremities or on axial skeleton.  Abdomen: Soft nontender  Neuro: Cranial nerves II through XII are intact, neurovascularly intact in all extremities with 2+ DTRs and 2+ pulses.  Lymph: No lymphadenopathy of posterior or anterior cervical chain or axillae bilaterally.  Gait normal with good balance and coordination.  MSK:  Non tender  with full range of motion and good stability and symmetric strength and tone of  elbows, wrist, hip, knee and ankles bilaterally.  Shoulder: Right Inspection reveals no abnormalities, atrophy or asymmetry. Palpation is normal with no tenderness over AC joint or bicipital groove. ROM is full in all planes. Rotator cuff strength normal throughout. Positive impingement with Neer's and Hawkins Speeds and Yergason's tests normal. No labral pathology noted with negative Obrien's, negative clunk and good stability. Normal scapular function observed. No painful arc and no drop arm sign. No apprehension sign Contralateral shoulder unremarkable  MSK US performed of: Right shoulder This study was ordered, performed, and interpreted by Terrilee Files D.O.  Shoulder:   Supraspinatus:  Appears normal on long and transverse views, no bursal bulge seen with shoulder abduction on impingement view. Infraspinatus and looked fairly unremarkable AC joint: Mild arthritic changes Glenohumeral Joint:  Appears normal without effusion. Glenoid Labrum: Improvement in the amount of calcific changes but patient still has what appears to be a small tear. Biceps Tendon:  Appears normal on long and transverse views, no fraying of tendon, tendon located in intertubercular groove, no subluxation with shoulder internal or external rotation. No increased power doppler signal. Impression: Improvement in calcific changes noted previously   . The above documentation has been reviewed and is accurate and complete Patricia Saa, DO       Note: This dictation was prepared with Dragon dictation along with smaller phrase technology. Any transcriptional errors that result from this process are unintentional.

## 2018-04-05 ENCOUNTER — Ambulatory Visit (INDEPENDENT_AMBULATORY_CARE_PROVIDER_SITE_OTHER): Payer: 59 | Admitting: Family Medicine

## 2018-04-05 ENCOUNTER — Ambulatory Visit: Payer: Self-pay

## 2018-04-05 ENCOUNTER — Encounter: Payer: Self-pay | Admitting: Family Medicine

## 2018-04-05 VITALS — BP 126/82 | HR 98 | Ht 63.5 in | Wt 159.0 lb

## 2018-04-05 DIAGNOSIS — M25511 Pain in right shoulder: Principal | ICD-10-CM

## 2018-04-05 DIAGNOSIS — G8929 Other chronic pain: Secondary | ICD-10-CM | POA: Diagnosis not present

## 2018-04-05 NOTE — Patient Instructions (Signed)
Great to see you  pennsaid pinkie amount topically 2 times daily as needed.   Ice 20 minutes 2 times daily. Usually after activity and before bed. Lets give it another 6 weeks. Should do well

## 2018-04-05 NOTE — Assessment & Plan Note (Signed)
Patient did initially have some calcific changes noted of the rotator cuff and also had what appeared to be in the labrum.  This seems to have improved.  He did have PRP from an outside facility.  May be causing some improvement.  Patient will continue with conservative therapy and start increasing activity and exercise prescription given.  Follow-up with me again in 6 to 8 weeks

## 2021-02-25 ENCOUNTER — Ambulatory Visit: Payer: Self-pay

## 2021-04-24 ENCOUNTER — Telehealth: Payer: Self-pay

## 2021-04-24 NOTE — Telephone Encounter (Signed)
NOTES SCANNED TO REFERRAL 

## 2021-05-20 NOTE — Progress Notes (Signed)
? ? ?Delton See MD ?Reason for referral-hyperlipidemia ? ?HPI: 56 year old female for evaluation of hyperlipidemia at request of Angelena Sole MD. Patient does have some dyspnea on exertion but no orthopnea, PND or pedal edema.  She has had an occasional pain in her chest after working out that she attributes to musculoskeletal pain.  She typically does not have exertional chest pain.  She has had what she describes as panic attacks in the past associated with elevated heart rate.  Recently she was exercising and did not have syncope but lost her train of thought for several minutes.  Cardiology now asked to evaluate. ? ?Current Outpatient Medications  ?Medication Sig Dispense Refill  ? ARMOUR THYROID 120 MG tablet Take 120 mg by mouth every morning.    ? ascorbic acid (VITAMIN C) 100 MG tablet Take by mouth.    ? Cholecalciferol 1.25 MG (50000 UT) capsule Take 1 capsule by mouth once a week.    ? cyanocobalamin 100 MCG tablet Take by mouth.    ? ESTROGENS CONJUGATED PO Take by mouth.    ? Multiple Vitamin (MULTIVITAMIN) tablet Take 1 tablet by mouth daily.    ? Progesterone Micronized (PROGESTERONE PO) Take by mouth.    ? Thyroid 97.5 MG TABS Take by mouth.    ? Turmeric 500 MG CAPS Take by mouth.    ? Zinc 50 MG TABS Take by mouth.    ? ?No current facility-administered medications for this visit.  ? ? ?Allergies  ?Allergen Reactions  ? Penicillins Rash  ?  As a child  ? ? ? ?Past Medical History:  ?Diagnosis Date  ? Diverticulosis   ? Hyperlipidemia   ? Hypothyroid   ? Panic attacks   ? ? ?Past Surgical History:  ?Procedure Laterality Date  ? ABDOMINOPLASTY/PANNICULECTOMY WITH LIPOSUCTION    ? CESAREAN SECTION    ? ? ?Social History  ? ?Socioeconomic History  ? Marital status: Married  ?  Spouse name: Not on file  ? Number of children: 2  ? Years of education: Not on file  ? Highest education level: Not on file  ?Occupational History  ? Not on file  ?Tobacco Use  ? Smoking status: Former  ?  Smokeless tobacco: Never  ?Substance and Sexual Activity  ? Alcohol use: Yes  ?  Comment: Occasional  ? Drug use: No  ? Sexual activity: Not on file  ?Other Topics Concern  ? Not on file  ?Social History Narrative  ? Not on file  ? ?Social Determinants of Health  ? ?Financial Resource Strain: Not on file  ?Food Insecurity: Not on file  ?Transportation Needs: Not on file  ?Physical Activity: Not on file  ?Stress: Not on file  ?Social Connections: Not on file  ?Intimate Partner Violence: Not on file  ? ? ?Family History  ?Problem Relation Age of Onset  ? Ulcerative colitis Mother   ? Diverticulitis Mother   ? Cancer Father   ? Diverticulitis Father   ? ? ?ROS: no fevers or chills, productive cough, hemoptysis, dysphasia, odynophagia, melena, hematochezia, dysuria, hematuria, rash, seizure activity, orthopnea, PND, pedal edema, claudication. Remaining systems are negative. ? ?Physical Exam:  ? ?Blood pressure 138/82, pulse 69, resp. rate 20, height 5\' 3"  (1.6 m), weight 156 lb 12.8 oz (71.1 kg), SpO2 98 %. ? ?General:  Well developed/well nourished in NAD ?Skin warm/dry ?Patient not depressed ?No peripheral clubbing ?Back-normal ?HEENT-normal/normal eyelids ?Neck supple/normal carotid upstroke bilaterally; no bruits; no JVD; no thyromegaly ?chest -  CTA/ normal expansion ?CV - RRR/normal S1 and S2; no murmurs, rubs or gallops;  PMI nondisplaced ?Abdomen -NT/ND, no HSM, no mass, + bowel sounds, no bruit ?2+ femoral pulses, no bruits ?Ext-no edema, chords, 2+ DP ?Neuro-grossly nonfocal ? ?ECG -normal sinus rhythm at a rate of 69, left anterior fascicular block, left ventricular hypertrophy.  Personally reviewed ? ?A/P ? ?1 palpitations-patient has had occasional palpitations associated with panic attacks by her report in the past.  We will arrange an echocardiogram to assess LV function.  We also discussed a smart watch to record any rhythm strips associated with her palpitations. ? ?2 elevated blood pressure-vascular to  follow her blood pressure at home and we will add medications as needed.  Goal systolic blood pressure of 130 and diastolic blood pressure 85. ? ?3 history of hyperlipidemia-we will arrange a calcium score.  If she has coronary calcification she would benefit from a statin in the future. ? ?Olga Millers, MD ? ?

## 2021-05-28 ENCOUNTER — Encounter: Payer: Self-pay | Admitting: Cardiology

## 2021-05-28 ENCOUNTER — Ambulatory Visit (INDEPENDENT_AMBULATORY_CARE_PROVIDER_SITE_OTHER): Payer: 59 | Admitting: Cardiology

## 2021-05-28 VITALS — BP 138/82 | HR 69 | Resp 20 | Ht 63.0 in | Wt 156.8 lb

## 2021-05-28 DIAGNOSIS — E7800 Pure hypercholesterolemia, unspecified: Secondary | ICD-10-CM

## 2021-05-28 DIAGNOSIS — R002 Palpitations: Secondary | ICD-10-CM

## 2021-05-28 NOTE — Patient Instructions (Signed)
?  Testing/Procedures: ? ?Your physician has requested that you have an echocardiogram. Echocardiography is a painless test that uses sound waves to create images of your heart. It provides your doctor with information about the size and shape of your heart and how well your heart?s chambers and valves are working. This procedure takes approximately one hour. There are no restrictions for this procedure. [facility] ? ?CORONARY CALCIUM SCORING CT SCAN AT THE [facility] ? ? ?Follow-Up: ?At [facility], you and your health needs are our priority.  As part of our continuing mission to provide you with exceptional heart care, we have created designated Provider Care Teams.  These Care Teams include your primary Cardiologist (physician) and Advanced Practice Providers (APPs -  Physician Assistants and Nurse Practitioners) who all work together to provide you with the care you need, when you need it. ? ?We recommend signing up for the patient portal called "MyChart".  Sign up information is provided on this After Visit Summary.  MyChart is used to connect with patients for Virtual Visits (Telemedicine).  Patients are able to view lab/test results, encounter notes, upcoming appointments, etc.  Non-urgent messages can be sent to your provider as well.   ?To learn more about what you can do with MyChart, go to [email or web address].   ? ?Your next appointment:   ?6 month(s) ? ?The format for your next appointment:   ?In Person ? ?Provider:   ?Olga Millers MD ? ? ? ?

## 2021-06-04 ENCOUNTER — Ambulatory Visit (HOSPITAL_BASED_OUTPATIENT_CLINIC_OR_DEPARTMENT_OTHER)
Admission: RE | Admit: 2021-06-04 | Discharge: 2021-06-04 | Disposition: A | Discharge status: Home / Self Care | Payer: 59 | Source: Ambulatory Visit | Attending: Cardiology | Admitting: Cardiology

## 2021-06-04 ENCOUNTER — Ambulatory Visit (INDEPENDENT_AMBULATORY_CARE_PROVIDER_SITE_OTHER): Payer: 59

## 2021-06-04 ENCOUNTER — Encounter (HOSPITAL_BASED_OUTPATIENT_CLINIC_OR_DEPARTMENT_OTHER): Payer: Self-pay

## 2021-06-04 DIAGNOSIS — R002 Palpitations: Secondary | ICD-10-CM | POA: Insufficient documentation

## 2021-06-04 LAB — ECHOCARDIOGRAM COMPLETE
AR max vel: 1.99 cm2
AV Area VTI: 1.94 cm2
AV Area mean vel: 1.87 cm2
AV Mean grad: 5 mmHg
AV Peak grad: 8 mmHg
Ao pk vel: 1.41 m/s
Area-P 1/2: 3.63 cm2
Calc EF: 72.4 %
S' Lateral: 2.61 cm
Single Plane A2C EF: 73.4 %
Single Plane A4C EF: 70.1 %

## 2021-06-10 ENCOUNTER — Encounter: Payer: Self-pay | Admitting: *Deleted

## 2022-12-24 ENCOUNTER — Other Ambulatory Visit (HOSPITAL_BASED_OUTPATIENT_CLINIC_OR_DEPARTMENT_OTHER): Payer: Self-pay

## 2022-12-25 ENCOUNTER — Other Ambulatory Visit (HOSPITAL_BASED_OUTPATIENT_CLINIC_OR_DEPARTMENT_OTHER): Payer: Self-pay

## 2022-12-25 MED ORDER — INFLUENZA VAC TISS-CULT SUBUNT 0.5 ML IM SUSY
0.5000 mL | PREFILLED_SYRINGE | Freq: Once | INTRAMUSCULAR | 0 refills | Status: AC
  Filled 2022-12-25: qty 0.5, 1d supply, fill #0
